# Patient Record
Sex: Male | Born: 1980
Health system: Southern US, Community
[De-identification: ages and names within clinical notes are randomized; demographics above are authoritative.]

---
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
## 2002-05-30 ENCOUNTER — Encounter: Payer: Self-pay | Admitting: Emergency Medicine

## 2002-05-30 ENCOUNTER — Emergency Department (HOSPITAL_COMMUNITY): Admission: EM | Admit: 2002-05-30 | Discharge: 2002-05-30 | Payer: Self-pay | Admitting: Emergency Medicine

## 2003-01-26 ENCOUNTER — Emergency Department (HOSPITAL_COMMUNITY): Admission: EM | Admit: 2003-01-26 | Discharge: 2003-01-26 | Payer: Self-pay

## 2003-09-22 ENCOUNTER — Emergency Department (HOSPITAL_COMMUNITY): Admission: EM | Admit: 2003-09-22 | Discharge: 2003-09-23 | Payer: Self-pay | Admitting: Emergency Medicine

## 2003-09-24 ENCOUNTER — Emergency Department (HOSPITAL_COMMUNITY): Admission: EM | Admit: 2003-09-24 | Discharge: 2003-09-24 | Payer: Self-pay | Admitting: *Deleted

## 2004-12-03 ENCOUNTER — Ambulatory Visit: Payer: Self-pay | Admitting: Family Medicine

## 2004-12-11 ENCOUNTER — Ambulatory Visit: Payer: Self-pay | Admitting: Sports Medicine

## 2005-01-31 ENCOUNTER — Emergency Department (HOSPITAL_COMMUNITY): Admission: EM | Admit: 2005-01-31 | Discharge: 2005-01-31 | Payer: Self-pay | Admitting: Emergency Medicine

## 2005-02-06 ENCOUNTER — Emergency Department (HOSPITAL_COMMUNITY): Admission: EM | Admit: 2005-02-06 | Discharge: 2005-02-06 | Payer: Self-pay | Admitting: Emergency Medicine

## 2006-07-19 ENCOUNTER — Ambulatory Visit: Payer: Self-pay | Admitting: Family Medicine

## 2006-07-21 ENCOUNTER — Ambulatory Visit: Payer: Self-pay | Admitting: Sports Medicine

## 2007-01-06 ENCOUNTER — Emergency Department (HOSPITAL_COMMUNITY): Admission: EM | Admit: 2007-01-06 | Discharge: 2007-01-07 | Payer: Self-pay | Admitting: Emergency Medicine

## 2008-09-02 ENCOUNTER — Telehealth: Payer: Self-pay | Admitting: *Deleted

## 2008-09-03 ENCOUNTER — Ambulatory Visit: Payer: Self-pay | Admitting: Family Medicine

## 2008-09-03 ENCOUNTER — Encounter (INDEPENDENT_AMBULATORY_CARE_PROVIDER_SITE_OTHER): Payer: Self-pay | Admitting: Family Medicine

## 2008-09-04 LAB — CONVERTED CEMR LAB
Chlamydia, Swab/Urine, PCR: NEGATIVE
GC Probe Amp, Urine: NEGATIVE

## 2009-02-13 ENCOUNTER — Emergency Department (HOSPITAL_COMMUNITY): Admission: EM | Admit: 2009-02-13 | Discharge: 2009-02-13 | Payer: Self-pay | Admitting: Emergency Medicine

## 2009-02-25 ENCOUNTER — Ambulatory Visit: Payer: Self-pay | Admitting: Family Medicine

## 2009-08-04 ENCOUNTER — Encounter: Payer: Self-pay | Admitting: Family Medicine

## 2009-08-04 ENCOUNTER — Ambulatory Visit: Payer: Self-pay | Admitting: Family Medicine

## 2009-08-04 DIAGNOSIS — F1010 Alcohol abuse, uncomplicated: Secondary | ICD-10-CM | POA: Insufficient documentation

## 2009-08-04 DIAGNOSIS — R599 Enlarged lymph nodes, unspecified: Secondary | ICD-10-CM | POA: Insufficient documentation

## 2009-08-04 DIAGNOSIS — F17200 Nicotine dependence, unspecified, uncomplicated: Secondary | ICD-10-CM

## 2009-08-04 LAB — CONVERTED CEMR LAB
Chlamydia, Swab/Urine, PCR: NEGATIVE
GC Probe Amp, Urine: NEGATIVE

## 2009-08-06 ENCOUNTER — Encounter: Payer: Self-pay | Admitting: Family Medicine

## 2009-11-03 ENCOUNTER — Encounter: Payer: Self-pay | Admitting: Family Medicine

## 2009-11-03 ENCOUNTER — Ambulatory Visit: Payer: Self-pay | Admitting: Family Medicine

## 2009-11-03 LAB — CONVERTED CEMR LAB
Basophils Relative: 0 % (ref 0–1)
CO2: 27 meq/L (ref 19–32)
Calcium: 9.4 mg/dL (ref 8.4–10.5)
HDL: 70 mg/dL (ref >39)
Lymphs Abs: 3.2 10*3/uL (ref 0.7–4.0)
Monocytes Relative: 10 % (ref 3–12)
Neutro Abs: 0.7 10*3/uL — ABNORMAL LOW (ref 1.7–7.7)
Neutrophils Relative %: 16 % — ABNORMAL LOW (ref 43–77)
RBC: 4.68 M/uL (ref 4.22–5.81)
Sodium: 140 meq/L (ref 135–145)
Total CHOL/HDL Ratio: 2
WBC: 4.3 10*3/uL (ref 4.0–10.5)

## 2009-11-07 ENCOUNTER — Telehealth: Payer: Self-pay | Admitting: Family Medicine

## 2009-11-07 DIAGNOSIS — D708 Other neutropenia: Secondary | ICD-10-CM

## 2009-11-11 ENCOUNTER — Encounter: Payer: Self-pay | Admitting: Family Medicine

## 2009-11-13 ENCOUNTER — Encounter: Payer: Self-pay | Admitting: Family Medicine

## 2009-11-13 ENCOUNTER — Ambulatory Visit: Payer: Self-pay | Admitting: Family Medicine

## 2009-11-13 LAB — CONVERTED CEMR LAB
Basophils Absolute: 0 10*3/uL (ref 0.0–0.1)
Basophils Relative: 0 % (ref 0–1)
Eosinophils Absolute: 0.2 10*3/uL (ref 0.0–0.7)
Lymphs Abs: 2.1 10*3/uL (ref 0.7–4.0)
MCHC: 34.1 g/dL (ref 30.0–36.0)
MCV: 96.6 fL (ref 78.0–100.0)
Neutrophils Relative %: 22 % — ABNORMAL LOW (ref 43–77)
Platelets: 260 10*3/uL (ref 150–400)
RDW: 11.8 % (ref 11.5–15.5)
WBC: 3.3 10*3/uL — ABNORMAL LOW (ref 4.0–10.5)

## 2009-11-20 ENCOUNTER — Telehealth: Payer: Self-pay | Admitting: Family Medicine

## 2009-11-27 ENCOUNTER — Ambulatory Visit: Payer: Self-pay | Admitting: Family Medicine

## 2009-11-28 ENCOUNTER — Ambulatory Visit: Payer: Self-pay | Admitting: Oncology

## 2009-12-18 ENCOUNTER — Telehealth: Payer: Self-pay | Admitting: *Deleted

## 2010-01-08 ENCOUNTER — Telehealth (INDEPENDENT_AMBULATORY_CARE_PROVIDER_SITE_OTHER): Payer: Self-pay | Admitting: Family Medicine

## 2010-01-08 ENCOUNTER — Telehealth: Payer: Self-pay | Admitting: Family Medicine

## 2010-01-21 ENCOUNTER — Ambulatory Visit: Payer: Self-pay | Admitting: Oncology

## 2010-01-22 ENCOUNTER — Encounter: Payer: Self-pay | Admitting: Family Medicine

## 2010-01-22 LAB — MORPHOLOGY

## 2010-01-22 LAB — CHCC SMEAR

## 2010-01-22 LAB — CBC WITH DIFFERENTIAL/PLATELET
EOS%: 5.8 % (ref 0.0–7.0)
LYMPH%: 55.2 % — ABNORMAL HIGH (ref 14.0–49.0)
MCH: 33.7 pg — ABNORMAL HIGH (ref 27.2–33.4)
MCHC: 34.5 g/dL (ref 32.0–36.0)
MCV: 97.5 fL (ref 79.3–98.0)
MONO%: 12.3 % (ref 0.0–14.0)
RBC: 4.29 10*6/uL (ref 4.20–5.82)
RDW: 11.9 % (ref 11.0–14.6)

## 2010-01-23 LAB — COMPREHENSIVE METABOLIC PANEL
ALT: 14 U/L (ref 0–53)
AST: 23 U/L (ref 0–37)
CO2: 24 mEq/L (ref 19–32)
Calcium: 9.2 mg/dL (ref 8.4–10.5)
Chloride: 103 mEq/L (ref 96–112)
Sodium: 138 mEq/L (ref 135–145)
Total Bilirubin: 0.5 mg/dL (ref 0.3–1.2)
Total Protein: 7.1 g/dL (ref 6.0–8.3)

## 2010-01-23 LAB — ANA: Anti Nuclear Antibody(ANA): NEGATIVE

## 2010-01-23 LAB — HEPATITIS C ANTIBODY: HCV Ab: NEGATIVE

## 2010-01-23 LAB — FOLATE: Folate: 11 ng/mL

## 2010-04-28 ENCOUNTER — Ambulatory Visit: Payer: Self-pay | Admitting: Oncology

## 2010-10-13 NOTE — Assessment & Plan Note (Signed)
Summary: knot on neck,df   Vital Signs:  Patient profile:   30 year old male Weight:      137.1 pounds Temp:     97.9 degrees F oral Pulse rate:   84 / minute Pulse rhythm:   regular BP sitting:   128 / 84  (right arm) Cuff size:   regular  Vitals Entered By: Loralee Pacas CMA (November 03, 2009 8:57 AM)  CC:  lymphadenopathy, damily hx dm, and tobacco.  History of Present Illness: 1.  cervical lymphadenopathy--seen in noveberm 2011 for cervical LAD.  all LNs <1 cm.  plan was to check CBC and watchful waiting.  Pt did not come back for cbc.  Today he comes in for follow up.  he states they go up and down.  not any bigger.  not tender.  no axillary or groin nodes.    2.  family hx of diabetes--needs screening.  Father and uncle have diabetes and a few other family members  3.  tobacco--still smoking.  has smoking cessation brochure at home.  has not really thought about specific strategies for quitting.    Allergies: No Known Drug Allergies  Review of Systems General:  Denies chills, fatigue, fever, malaise, sweats, and weight loss. ENT:  Denies difficulty swallowing, ear discharge, earache, hoarseness, nasal congestion, and nosebleeds; ocassional sore throat. Resp:  Denies cough, shortness of breath, and wheezing.  Physical Exam  General:  Well-developed,well-nourished,in no acute distress; alert,appropriate and cooperative throughout examination Neck:  anterior and posterior shotty cervical lymphadenopathy all <1 cm Cervical Nodes:  see neck exam Axillary Nodes:  No palpable lymphadenopathy Inguinal Nodes:  No significant adenopathy Additional Exam:  vital signs reviewed    Impression & Recommendations:  Problem # 1:  LYMPHADENOPATHY (ICD-785.6) Assessment Unchanged  unchanged from previous exam.  cannot appreciate axilla or groin adenopathy.  no red flag symptoms to indicate an ominous cause.  He does endorse some post nasal drip-->wonder if this is the cause.  check  CBC with diff.    Orders: [facility]- Est  Level 4 ([address])  Problem # 2:  Family Hx of DM (ICD-250.00) Assessment: Unchanged  check fasting glucose for screening.  also FLP  Orders: [facility]- Est  Level 4 ([address])  Problem # 3:  TOBACCO ABUSE (ICD-305.1) Assessment: Unchanged  already has smoking cessation brochure.  I think he is really more at the pre-contemplation stage  Orders: [facility]- Est  Level 4 ([address])  Patient Instructions: 1)  It was nice to see you today. 2)  I will call you if your labs are not normal.  Otherwise, I will send you a letter. 3)  Let us know if you need help with quitting smoking.  Try calling 1-800-QUITNOW.   4)  Please schedule a follow-up appointment in as needed.   Be sure to come in if the knots in your neck get bigger or if you start getting new ones in your neck, armpits, or groin.   Geriatric Assessment:  Activities of Daily Living:    Bathing-independent    Dressing-independent    Eating-independent    Toileting-independent    Transferring-independent    Continence-independent  Instrumental Activities of Daily Living:    Transportation-independent    Meal/Food Preparation-independent    Shopping Errands-independent    Housekeeping/Chores-independent    Money Management/Finances-independent    Medication Management-independent    Ability to Use Telephone-independent    Laundry-independent  Appended Document: knot on neck,df    Clinical Lists Changes  Problems:  Removed problem of Family history of  DM (ICD-250.00) Added new problem of FAMILY HISTORY OF DIABETES MELLITUS (ICD-V18.0)

## 2010-10-13 NOTE — Progress Notes (Signed)
Summary: phn msg  Phone Note Call from Patient Call back at Home Phone [phone]   Caller: Patient Summary of Call: cannot come today b/c of transporation, wants to know if doc can make appt for Hem instead of having to come into this clinic first.  pls advise Initial call taken by: De Nurse,  January 08, 2010 10:02 AM  Follow-up for Phone Call        to PCP Follow-up by: Gladstone Pih,  January 08, 2010 12:00 PM  Additional Follow-up for Phone Call Additional follow up Details #1::        yes.  I have called heme-onc and they are willing to see him.  please let him know that he MUST get to this next appointment or they will not reschedule.  they have not given [place] an appt time yet, but we will let him know as soon as they call us.  thanks Additional Follow-up by: Asher Muir MD,  January 08, 2010 12:40 PM    Additional Follow-up for Phone Call Additional follow up Details #2::    Pt notified, voiced understanding Follow-up by: Gladstone Pih,  January 08, 2010 1:46 PM

## 2010-10-13 NOTE — Progress Notes (Signed)
Summary: appt  Phone Note Call from Patient Call back at [phone]   Reason for Call: Talk to Nurse Summary of Call: mom is calling re: pts appt [facility] the [facility], he missed the appt and was told he needs to contact his pcp Initial call taken by: Knox Royalty,  December 18, 2009 11:27 AM  Follow-up for Phone Call        spoke with pts mother and asked why did he miss his appt and she stated that it was bc of transportation issues. I called Renee @ [facility] and left vm Follow-up by: Loralee Pacas CMA,  December 19, 2009 9:42 AM

## 2010-10-13 NOTE — Miscellaneous (Signed)
Summary: Orders Update  Clinical Lists Changes  Orders: Added new Test order of [facility] ([id number]) - Signed

## 2010-10-13 NOTE — Progress Notes (Signed)
  Phone Note Outgoing Call   Call placed by: Asher Muir MD,  November 07, 2009 1:28 PM Summary of Call: called pt.  neutrophil count low.  need to recheck next week.  could be from viral suppression.  asked him to make a lab appt for next thursday.  i will put in order.  pt expressed understanding Initial call taken by: Asher Muir MD,  November 07, 2009 1:29 PM  New Problems: OTHER NEUTROPENIA (ICD-288.09)   New Problems: OTHER NEUTROPENIA (ICD-288.09)

## 2010-10-13 NOTE — Consult Note (Signed)
Summary: [facility]  [facility]   Imported By: Clydell Hakim 02/09/2010 08:44:23  _____________________________________________________________________  External Attachment:    Type:   Image     Comment:   External Document  Appended Document: [facility]    Clinical Lists Changes  Problems: Assessed OTHER NEUTROPENIA as unchanged - UPDATE FROM HEME/ONC CONSULTATION:   Seen by Dr. Gaylyn Rong, who believes that alcohol abuse is most likely cause of his neutropenia.  Pt declined bone marrow biopsy at this time.  Plan is for Jonathin to stop drinking for the next 3-4 months.  He is to see Dr. Gaylyn Rong in August 2011 and have repeat CBC.  If still neutropenic while not drinking, then bone marrow biopsy will be performed at that time.       Impression & Recommendations:  Problem # 1:  OTHER NEUTROPENIA (ICD-288.09) Assessment Unchanged UPDATE FROM HEME/ONC CONSULTATION:   Seen by Dr. Gaylyn Rong, who believes that alcohol abuse is most likely cause of his neutropenia.  Pt declined bone marrow biopsy at this time.  Plan is for Carless to stop drinking for the next 3-4 months.  He is to see Dr. Gaylyn Rong in August 2011 and have repeat CBC.  If still neutropenic while not drinking, then bone marrow biopsy will be performed at that time.

## 2010-10-13 NOTE — Progress Notes (Signed)
  Phone Note Call from Patient   Summary of Call: pt rescheduled appt to next thursday.  called him and told him hiv negative, but one of his cell lines low.  emphasized need to come to appt.  he expressed understanding  Initial call taken by: Asher Muir MD,  November 20, 2009 11:45 AM

## 2010-10-13 NOTE — Progress Notes (Signed)
  Phone Note Outgoing Call   Call placed by: Asher Muir MD,  January 08, 2010 9:25 AM Summary of Call: Pt has rescheduled/no-showed for 3 appointments.  Drucie Ip (new pt scheduler) stated that I needed to speak with Dr Gaylyn Rong (the hematologist with whom Jaque was scheduled).  I spoke with Dr Gaylyn Rong who stated it was OK to reschedule appt.  I called and left a mssg for Rena asking her to call me back. Initial call taken by: Asher Muir MD,  January 08, 2010 9:28 AM  Follow-up for Phone Call        would you mind calling the patient or heme to see if he got an appointment?  thanks! Follow-up by: Asher Muir MD,  Jan 12, 2010 11:35 AM  Additional Follow-up for Phone Call Additional follow up Details #1::        Apt on May 12th Additional Follow-up by: Gladstone Pih,  Jan 12, 2010 11:57 AM

## 2010-10-13 NOTE — Letter (Signed)
Summary: Generic Letter  [facility]  [address]   [place], [place] [address]   Phone: [phone]  Fax: [phone]    11/03/2009  Taniela Locken [address] [place], [place]  [address]  To whom it may concern:  Mr Pellow is able to function independently as an adult.  He manages his own finances, lives alone.  He completes all of his ADLs and IADLs independently.  Please call me if you have any questions or concerns.              Sincerely,   Asher Muir MD

## 2010-10-13 NOTE — Assessment & Plan Note (Signed)
Summary: f/u eo   Vital Signs:  Patient profile:   30 year old male Weight:      136.2 pounds Temp:     97.9 degrees F oral Pulse rate:   75 / minute Pulse rhythm:   regular BP sitting:   129 / 86  (left arm) Cuff size:   regular  Vitals Entered By: Loralee Pacas CMA (November 27, 2009 1:53 PM)  CC:  lymphadenopathy, neutropenia, and alcohol use.  History of Present Illness: 1.  cervical lymphadenopathy--seen in noveber 2011 for cervical LAD.  all LNs <1 cm.  CBC showed neutropenia (ANC 700).  Pt notices that LNs get bigger and smaller.  never felt any in his axilla, but has felt some in his groin before.    2.  neutropenia--anc 700.  repeat with smear was again 700.  WBC initiall normal.  then on repeat only slightly low at 3.3.  hiv negative.  no known family hx of neutropenia or other white cell disorders.  see extensive ROS, which is essentially negative.  he wonders if his alcohol use could contribute.  3.  alcohol use--  drinks 5-6 40 oz beers most days.  some days does not drink at all.  will drink beers in the am if he has them, but does not need an eye opener.  thinks he could quit on his own; does not think he needs AA  Allergies: No Known Drug Allergies  Past History:  Past Medical History: Treated for chlamydia learning disabilities in school.  can read and write.    Past Surgical History: Reviewed history from 08/04/2009 and no changes required. age 17YO had "fluid on the brain"  had surgery for this.  no shunt.  no problems since.  cared for at [facility]  Social History: Reviewed history from 08/04/2009 and no changes required. lives alone.  on disability from the brain surgery.  smokes 1/2 ppd.  drinks about 5-6 40oz beers per day.     1 sexual partner.  straight  Review of Systems General:  Denies fatigue, fever, malaise, and weight loss; appetitie not very good, but thinks that is because he drinks lots of beer; sleep is good. ENT:  Denies ear discharge,  earache, nasal congestion, nosebleeds, postnasal drainage, and sore throat. CV:  Denies chest pain or discomfort and difficulty breathing while lying down. Resp:  Denies cough, coughing up blood, hypersomnolence, and morning headaches. GI:  Denies abdominal pain, bloody stools, constipation, and nausea. MS:  Denies joint pain and muscle weakness. Derm:  Denies rash. Neuro:  Denies difficulty with concentration, falling down, headaches, memory loss, and poor balance. Heme:  Complains of enlarge lymph nodes; denies bleeding and fevers.  Physical Exam  General:  Well-developed,well-nourished,in no acute distress; alert,appropriate and cooperative throughout examination Eyes:  normal appearance Ears:  tms normal Nose:  External nasal examination shows no deformity or inflammation. Nasal mucosa are pink and moist without lesions or exudates. Mouth:  mild pharyngeal erythema and 2+ tonsils, but no exudate.  no oral lesions or obvious gingival abnormalities Neck:  anterior and posterior shotty cervical lymphadenopathy all <1 cm Lungs:  Normal respiratory effort, chest expands symmetrically. Lungs are clear to auscultation, no crackles or wheezes. Heart:  Normal rate and regular rhythm. S1 and S2 normal without gallop, murmur, click, rub or other extra sounds. Abdomen:  soft, non-tender, and normal bowel sounds.  no splenomegaly Extremities:  no edema Skin:  Intact without suspicious lesions or rashes Cervical Nodes:  see  neck exam Axillary Nodes:  No palpable lymphadenopathy Inguinal Nodes:  No palpable adenopathy Additional Exam:  vital signs reviewed    Impression & Recommendations:  Problem # 1:  OTHER NEUTROPENIA (ICD-288.09) Assessment Unchanged moderate neutropenia with an ANC of 700 that was verified on repeat CBC.  unfortunately, peripheral smear did not meet criteria to get good morphology.  Possible causes of this are numerous and include:  a benign process associated with  ethnicity, cyclic neutropenia, alcohol use, autoimmune neutropenia, and many others.  He does not really have any symptoms.  An extensive ROS was negative, except for heavy alcohol use.  He does not use cocaine (which apparently can cause isolated neutropenia in rare instances).  He is not on any medications that would cause this.  I think the most prudent move is to refer to heme-onc for further evaluation.    Orders: Hematology Referral (Hematology) [facility]- Est  Level 4 ([address])  Problem # 2:  LYMPHADENOPATHY (ICD-785.6) Assessment: Unchanged  still all<1 cm.  none of the nodes are getting any bigger.  cannot appreciate any groin or axilla adenopathy.  continue to monitor  Orders: [facility]- Est  Level 4 ([address])  Problem # 3:  ALCOHOL ABUSE (ICD-305.00) Assessment: Unchanged  discussed that the amount he drinks is far above the healthy limit (which is 2 twelve ounce beers per day or less).  gave info on AA.  he agrees he needs to cut back.  unfortunately, at this time, he is not really interested in AA or other support groups  Orders: [facility]- Est  Level 4 ([address])  Patient Instructions: 1)  It was nice to see you today. 2)  We will refer you to the hematologist (blood doctor).  Be sure you go to that appointment. 3)  I think you have a drinking problem.  If you have a hard time cutting back on your own, think about going to alcohol[facility] (AA).  Let us know if we can help you.

## 2011-04-05 ENCOUNTER — Emergency Department (HOSPITAL_COMMUNITY): Payer: Self-pay

## 2011-04-05 ENCOUNTER — Emergency Department (HOSPITAL_COMMUNITY)
Admission: EM | Admit: 2011-04-05 | Discharge: 2011-04-05 | Discharge status: Against Medical Advice | Payer: Self-pay | Attending: Emergency Medicine | Admitting: Emergency Medicine

## 2011-04-05 DIAGNOSIS — Y9367 Activity, basketball: Secondary | ICD-10-CM | POA: Insufficient documentation

## 2011-04-05 DIAGNOSIS — T1490XA Injury, unspecified, initial encounter: Secondary | ICD-10-CM | POA: Insufficient documentation

## 2011-04-05 DIAGNOSIS — X58XXXA Exposure to other specified factors, initial encounter: Secondary | ICD-10-CM | POA: Insufficient documentation

## 2014-09-02 ENCOUNTER — Ambulatory Visit: Payer: Self-pay | Admitting: Family Medicine

## 2016-10-07 ENCOUNTER — Ambulatory Visit (HOSPITAL_COMMUNITY)
Admission: EM | Admit: 2016-10-07 | Discharge: 2016-10-07 | Disposition: A | Discharge status: Home / Self Care | Payer: Medicare Other | Attending: Family Medicine | Admitting: Family Medicine

## 2016-10-07 ENCOUNTER — Encounter (HOSPITAL_COMMUNITY): Payer: Self-pay | Admitting: Emergency Medicine

## 2016-10-07 DIAGNOSIS — Z202 Contact with and (suspected) exposure to infections with a predominantly sexual mode of transmission: Secondary | ICD-10-CM

## 2016-10-07 DIAGNOSIS — F17210 Nicotine dependence, cigarettes, uncomplicated: Secondary | ICD-10-CM | POA: Insufficient documentation

## 2016-10-07 DIAGNOSIS — R369 Urethral discharge, unspecified: Secondary | ICD-10-CM | POA: Diagnosis not present

## 2016-10-07 DIAGNOSIS — R300 Dysuria: Secondary | ICD-10-CM | POA: Diagnosis not present

## 2016-10-07 MED ORDER — STERILE WATER FOR INJECTION IJ SOLN
INTRAMUSCULAR | Status: AC
  Filled 2016-10-07: qty 10

## 2016-10-07 MED ORDER — AZITHROMYCIN 250 MG PO TABS
1000.0000 mg | ORAL_TABLET | Freq: Once | ORAL | Status: AC
  Administered 2016-10-07: 1000 mg via ORAL

## 2016-10-07 MED ORDER — CEFTRIAXONE SODIUM 250 MG IJ SOLR
INTRAMUSCULAR | Status: AC
  Filled 2016-10-07: qty 250

## 2016-10-07 MED ORDER — AZITHROMYCIN 250 MG PO TABS
ORAL_TABLET | ORAL | Status: AC
  Filled 2016-10-07: qty 4

## 2016-10-07 MED ORDER — CEFTRIAXONE SODIUM 250 MG IJ SOLR
250.0000 mg | Freq: Once | INTRAMUSCULAR | Status: AC
  Administered 2016-10-07: 250 mg via INTRAMUSCULAR

## 2016-10-07 NOTE — Discharge Instructions (Signed)
You are being treated today for STD exposure. You have been given an injection of rocephin and a tablet of Azithromycin. You are being tested for Gonorrhea, chlamydia. Syphilis, and HIV.  You will be notified of your results. Should your symptoms fail to resolve or worsen follow up with your primary care provider or return to clinic.

## 2016-10-07 NOTE — ED Provider Notes (Signed)
CSN: [id number]     Arrival date & time 10/07/16  1252 History   First MD Initiated Contact with Patient 10/07/16 1348     Chief Complaint  Patient presents with  . Exposure to STD   (Consider location/radiation/quality/duration/timing/severity/associated sxs/prior Treatment) 36 year old male presents with 3 day history of penile discharge and painful urination. He denies fever, nausea, abdominal pain, or flank pain. He is sexually active with multiple partners, intermittent condom usage.   The history is provided by the patient.    History reviewed. No pertinent past medical history. History reviewed. No pertinent surgical history. History reviewed. No pertinent family history. Social History  Substance Use Topics  . Smoking status: Current Every Day Smoker    Packs/day: 1.00    Types: Cigars  . Smokeless tobacco: Never Used  . Alcohol use Yes    Review of Systems  Reason unable to perform ROS: as covered in HPI.  All other systems reviewed and are negative.   Allergies  Patient has no known allergies.  Home Medications   Prior to Admission medications   Not on File   Meds Ordered and Administered this Visit   Medications  azithromycin (ZITHROMAX) tablet 1,000 mg (not administered)  cefTRIAXone (ROCEPHIN) injection 250 mg (not administered)    BP 147/78 (BP Location: Left Arm)   Pulse 89   Temp 98.1 F (36.7 C) (Oral)   Resp 16   SpO2 98%  No data found.   Physical Exam  Constitutional: He appears well-developed and well-nourished. No distress.  Cardiovascular: Normal rate and regular rhythm.   Pulmonary/Chest: Effort normal and breath sounds normal.  Abdominal: Soft. Bowel sounds are normal.  Genitourinary:  Genitourinary Comments: Deferred at pt request.  Skin: Skin is warm and dry. Capillary refill takes less than 2 seconds. He is not diaphoretic.  Psychiatric: He has a normal mood and affect.  Nursing note and vitals reviewed.   Urgent Care  Course     Procedures (including critical care time)  Labs Review Labs Reviewed  HIV ANTIBODY (ROUTINE TESTING)  RPR  URINE CYTOLOGY ANCILLARY ONLY    Imaging Review No results found.   Visual Acuity Review  Right Eye Distance:   Left Eye Distance:   Bilateral Distance:    Right Eye Near:   Left Eye Near:    Bilateral Near:         MDM   1. Penile discharge   2. STD exposure   You are being treated today for STD exposure. You have been given an injection of rocephin and a tablet of Azithromycin. You are being tested for Gonorrhea, chlamydia. Syphilis, and HIV.  You will be notified of your results. Should your symptoms fail to resolve or worsen follow up with your primary care provider or return to clinic.     Dorena BodoLawrence Asheton Viramontes, NP 10/07/16 1428

## 2016-10-07 NOTE — ED Triage Notes (Signed)
Here for yellow penile d/c   Sexual active w/mult partners w/occasional condom use  Here to be checked for STDs  Denies fevers  A&O x4... NAD

## 2016-10-08 LAB — URINE CYTOLOGY ANCILLARY ONLY
Chlamydia: NEGATIVE
Neisseria Gonorrhea: POSITIVE — AB

## 2016-10-08 LAB — HIV ANTIBODY (ROUTINE TESTING W REFLEX): HIV SCREEN 4TH GENERATION: NONREACTIVE

## 2016-10-08 LAB — RPR: RPR Ser Ql: NONREACTIVE

## 2017-04-04 ENCOUNTER — Emergency Department (HOSPITAL_COMMUNITY)
Admission: EM | Admit: 2017-04-04 | Discharge: 2017-04-05 | Disposition: A | Discharge status: Home / Self Care | Payer: Medicare Other | Attending: Emergency Medicine | Admitting: Emergency Medicine

## 2017-04-04 ENCOUNTER — Encounter (HOSPITAL_COMMUNITY): Payer: Self-pay | Admitting: Emergency Medicine

## 2017-04-04 ENCOUNTER — Emergency Department (HOSPITAL_COMMUNITY)
Admission: EM | Admit: 2017-04-04 | Discharge: 2017-04-04 | Discharge status: Against Medical Advice | Payer: Medicare Other | Source: Home / Self Care

## 2017-04-04 ENCOUNTER — Encounter (HOSPITAL_COMMUNITY): Payer: Self-pay

## 2017-04-04 DIAGNOSIS — Z5321 Procedure and treatment not carried out due to patient leaving prior to being seen by health care provider: Secondary | ICD-10-CM

## 2017-04-04 DIAGNOSIS — Y939 Activity, unspecified: Secondary | ICD-10-CM | POA: Insufficient documentation

## 2017-04-04 DIAGNOSIS — Y999 Unspecified external cause status: Secondary | ICD-10-CM | POA: Insufficient documentation

## 2017-04-04 DIAGNOSIS — T7840XA Allergy, unspecified, initial encounter: Secondary | ICD-10-CM | POA: Insufficient documentation

## 2017-04-04 DIAGNOSIS — W57XXXA Bitten or stung by nonvenomous insect and other nonvenomous arthropods, initial encounter: Secondary | ICD-10-CM | POA: Diagnosis not present

## 2017-04-04 DIAGNOSIS — R2232 Localized swelling, mass and lump, left upper limb: Secondary | ICD-10-CM | POA: Insufficient documentation

## 2017-04-04 DIAGNOSIS — S60461A Insect bite (nonvenomous) of left index finger, initial encounter: Secondary | ICD-10-CM | POA: Diagnosis present

## 2017-04-04 DIAGNOSIS — Y929 Unspecified place or not applicable: Secondary | ICD-10-CM | POA: Insufficient documentation

## 2017-04-04 DIAGNOSIS — F17290 Nicotine dependence, other tobacco product, uncomplicated: Secondary | ICD-10-CM | POA: Diagnosis not present

## 2017-04-04 DIAGNOSIS — S60562A Insect bite (nonvenomous) of left hand, initial encounter: Secondary | ICD-10-CM | POA: Diagnosis not present

## 2017-04-04 MED ORDER — DIPHENHYDRAMINE HCL 25 MG PO CAPS: 25.0000 mg | ORAL_CAPSULE | Freq: Four times a day (QID) | ORAL | 0 refills | Status: DC | PRN

## 2017-04-04 MED ORDER — IBUPROFEN 400 MG PO TABS
ORAL_TABLET | ORAL | Status: AC
  Filled 2017-04-04: qty 1

## 2017-04-04 NOTE — ED Provider Notes (Signed)
[facility]-EMERGENCY DEPT Provider Note   CSN: [id number] Arrival date & time: 04/04/17  2337     History   Chief Complaint Chief Complaint  Patient presents with  . Insect Bite    HPI Sean Gutierrez is a 36 y.o. male.  HPI  Patient presents to ED for evaluation of spider bite on left second digit. He states that the bite occurred prior to arrival. He is concerned that is a poisonous spider. He denies any previous reaction to spiders in the past. He reports gradual worsening of the swelling since the bite occurred. He has not tried taking any medications prior to arrival. He denies any fever, chills, injury, numbness, weakness, drainage or bleeding from site.  History reviewed. No pertinent past medical history.  Patient Active Problem List   Diagnosis Date Noted  . OTHER NEUTROPENIA 11/07/2009  . ALCOHOL ABUSE 08/04/2009  . TOBACCO ABUSE 08/04/2009  . LYMPHADENOPATHY 08/04/2009    History reviewed. No pertinent surgical history.     Home Medications    Prior to Admission medications   Medication Sig Start Date End Date Taking? Authorizing Provider  diphenhydrAMINE (BENADRYL) 25 mg capsule Take 1 capsule (25 mg total) by mouth every 6 (six) hours as needed. 04/04/17   Dietrich Pates, PA-C    Family History History reviewed. No pertinent family history.  Social History Social History  Substance Use Topics  . Smoking status: Current Every Day Smoker    Packs/day: 1.00    Types: Cigars  . Smokeless tobacco: Never Used  . Alcohol use Yes     Allergies   Patient has no known allergies.   Review of Systems Review of Systems  Constitutional: Negative for chills and fever.  HENT: Negative for drooling, sore throat, trouble swallowing and voice change.   Respiratory: Negative for shortness of breath, wheezing and stridor.   Gastrointestinal: Negative for nausea and vomiting.  Musculoskeletal: Positive for arthralgias.  Skin: Positive for color change and rash.      Physical Exam Updated Vital Signs There were no vitals taken for this visit.  Physical Exam  Constitutional: He appears well-developed and well-nourished. No distress.  HENT:  Head: Normocephalic and atraumatic.  Eyes: Conjunctivae and EOM are normal. No scleral icterus.  Neck: Normal range of motion.  Cardiovascular: Normal rate, regular rhythm and normal heart sounds.   Pulmonary/Chest: Effort normal and breath sounds normal. No respiratory distress.  Neurological: He is alert.  Skin: Rash noted. He is not diaphoretic.  Erythema and swelling noted around the left second digit knuckle. Pain with movement of the digit. Full active and passive range of motion of the wrist and other digits. There is no abscess or blistering noted.  Psychiatric: He has a normal mood and affect.  Nursing note and vitals reviewed.    ED Treatments / Results  Labs (all labs ordered are listed, but only abnormal results are displayed) Labs Reviewed - No data to display  EKG  EKG Interpretation None       Radiology No results found.  Procedures Procedures (including critical care time)  Medications Ordered in ED Medications - No data to display   Initial Impression / Assessment and Plan / ED Course  I have reviewed the triage vital signs and the nursing notes.  Pertinent labs & imaging results that were available during my care of the patient were reviewed by me and considered in my medical decision making (see chart for details).     Patient presents to  ED for evaluation of insect bite that he states occurred prior to arrival. He states that he is concerned that it is a poisonous spider bite. He reports gradual swelling and pain at the site. He denies any previous history of reaction to spider pain in the past. He denies fever, chills, nausea, vomiting, joint pains, vision changes, numbness, weakness, trouble walking. On physical exam there is localized erythematous reaction around  the left second digit with mild swelling present. Pt has a patent airway without stridor and is handling secretions without difficulty; no angioedema. No blisters, no pustules, no draining sinus tracts, no superficial abscesses, no bullous impetigo, no vesicles, no desquamation, no target lesions with dusky purpura or a central bulla. Low concern concern for superimposed infection. No concern for SJS, TEN, TSS, tick borne illness, syphilis or other life-threatening condition.   He has pain with movement of the digit but otherwise full active and passive range of motion of the digits and wrist. Patient has not tried any medications prior to arrival. He denies any history of fever or systemic symptoms. I suspect that this is a localized reaction to the insect bite that occurred. Since incident occurred today there is low likelihood that an infection has resulted this quickly. We'll advise patient to take Benadryl to prevent any further reaction. Will give patient a dose of Benadryl here in the ED but he is driving home. I encouraged him to pick it up on his way home to prevent any worsening of symptoms. Patient appears stable for discharge at this time. Strict return precautions given for severe or worsening symptoms.  Final Clinical Impressions(s) / ED Diagnoses   Final diagnoses:  Insect bite, initial encounter  Allergic reaction, initial encounter    New Prescriptions New Prescriptions   DIPHENHYDRAMINE (BENADRYL) 25 MG CAPSULE    Take 1 capsule (25 mg total) by mouth every 6 (six) hours as needed.     Dietrich PatesKhatri, Silvino Selman, PA-C 04/05/17 0001    Loren RacerYelverton, David, MD 04/07/17 2116

## 2017-04-04 NOTE — ED Notes (Signed)
Unable to locate pt x1; text page sent to patient via iConnect

## 2017-04-04 NOTE — ED Triage Notes (Signed)
Pt presents to ED for assessment of left first digit knuckle insect bite.  Pt states he walked through a spider web and is assuming it was aspider.  Some swelling and redness noted, marked.  Patient very anxious, concerned it was a poisonous spider and feels he needs to be seen by a provider immediately.  This RN attempted to reassure patient, patient asked to be alloowed back to lobby.

## 2017-04-04 NOTE — ED Triage Notes (Signed)
Patient here for spider bite to left hand.  Does not know what kind of spider but states it happed a hour ago.  States swelling began then. A&Ox4

## 2017-04-04 NOTE — Discharge Instructions (Signed)
Please read attached information regarding your condition. Take Benadryl every 6 hours as needed. Return to ED for worsening pain, increased swelling, lip swelling, trouble breathing, chest pain, signs of infection or fever.

## 2017-04-04 NOTE — ED Notes (Signed)
Unable to locate pt x2

## 2017-04-05 ENCOUNTER — Ambulatory Visit (HOSPITAL_COMMUNITY)
Admission: EM | Admit: 2017-04-05 | Discharge: 2017-04-05 | Disposition: A | Discharge status: Home / Self Care | Payer: Medicare Other | Attending: Family Medicine | Admitting: Family Medicine

## 2017-04-05 ENCOUNTER — Encounter (HOSPITAL_COMMUNITY): Payer: Self-pay | Admitting: Emergency Medicine

## 2017-04-05 DIAGNOSIS — W57XXXA Bitten or stung by nonvenomous insect and other nonvenomous arthropods, initial encounter: Secondary | ICD-10-CM

## 2017-04-05 DIAGNOSIS — S60461A Insect bite (nonvenomous) of left index finger, initial encounter: Secondary | ICD-10-CM

## 2017-04-05 DIAGNOSIS — M7989 Other specified soft tissue disorders: Secondary | ICD-10-CM

## 2017-04-05 DIAGNOSIS — R2232 Localized swelling, mass and lump, left upper limb: Secondary | ICD-10-CM

## 2017-04-05 MED ORDER — AMOXICILLIN-POT CLAVULANATE 875-125 MG PO TABS: 1.0000 | ORAL_TABLET | Freq: Two times a day (BID) | ORAL | 0 refills | Status: DC

## 2017-04-05 MED ORDER — METHYLPREDNISOLONE SODIUM SUCC 125 MG IJ SOLR
125.0000 mg | Freq: Once | INTRAMUSCULAR | Status: AC
  Administered 2017-04-05: 125 mg via INTRAMUSCULAR

## 2017-04-05 MED ORDER — METHYLPREDNISOLONE SODIUM SUCC 125 MG IJ SOLR
INTRAMUSCULAR | Status: AC
  Filled 2017-04-05: qty 2

## 2017-04-05 NOTE — ED Notes (Signed)
Patient Alert and oriented X4. Stable and ambulatory. Patient verbalized understanding of the discharge instructions.  Patient belongings were taken by the patient.  

## 2017-04-05 NOTE — ED Provider Notes (Signed)
  [facility]   [id number] 04/05/17 Arrival Time: 1001  ASSESSMENT & PLAN:  Today you were diagnosed with the following: 1. Insect bite, initial encounter   2. Swelling of left hand    You have been prescribed prescription medications this visit.   Meds ordered this encounter  Medications  . amoxicillin-clavulanate (AUGMENTIN) 875-125 MG tablet    Sig: Take 1 tablet by mouth every 12 (twelve) hours.    Dispense:  14 tablet    Refill:  0  . methylPREDNISolone sodium succinate (SOLU-MEDROL) 125 mg/2 mL injection 125 mg   Start taking the antibiotic this morning.  If you are not improving over the next 24 hours or feel you are worsening please follow up here or the Emergency Department if you are unable to see your regular doctor.  Question beginning of cellulitis vs inflammatory reaction to suspected insect bite. Reviewed expectations re: course of current medical issues. Questions answered. Outlined signs and symptoms indicating need for more acute intervention. Patient verbalized understanding. After Visit Summary given.   SUBJECTIVE:  Sean Gutierrez is a 36 y.o. male who presents with complaint of "insect bite" to L 2nd finger yesterday. Seen in the ED. Given Benadryl. No further self treatment by patient. This morning noticed L hand and wrist much more swollen. Painful with movement. Afebrile. No sensation changes.  ROS: As per HPI.   OBJECTIVE:  Vitals:   04/05/17 1011  BP: 133/86  Pulse: 97  Resp: 20  Temp: 98.4 F (36.9 C)  TempSrc: Oral  SpO2: 97%     General appearance: alert; no distress Lungs: clear to auscultation bilaterally Heart: regular rate and rhythm Extremities: L hand and wrist visibly swollen and tender to palpation; very slight erythema over L 2nd finger at PIP and MCP; hand slightly warm compared to opposite hand; sensation intact; normal capillary refill; decreased ROM secondary to discomfort   No Known Allergies  PMHx,  SurgHx, SocialHx, Medications, and Allergies were reviewed in the Visit Navigator and updated as appropriate.      Mardella LaymanHagler, Faysal Fenoglio, MD 04/05/17 1027

## 2017-04-05 NOTE — ED Triage Notes (Signed)
Pt here for possible spider bite to left hand onset last night.... Does not know what kind of spider but states his hand was caught in a web while lifting a window  Sts he woke up w/swelling of hand/forearm  [facility] sx  A&O x4... NAD... Ambulatory

## 2017-04-05 NOTE — Discharge Instructions (Signed)
Today you were diagnosed with the following: 1. Insect bite, initial encounter   2. Swelling of left hand    You have been prescribed prescription medications this visit.   Meds ordered this encounter  Medications   amoxicillin-clavulanate (AUGMENTIN) 875-125 MG tablet    Sig: Take 1 tablet by mouth every 12 (twelve) hours.    Dispense:  14 tablet    Refill:  0   methylPREDNISolone sodium succinate (SOLU-MEDROL) 125 mg/2 mL injection 125 mg   Start taking the antibiotic this morning.  If you are not improving over the next 24 hours or feel you are worsening please follow up here or the Emergency Department if you are unable to see your regular doctor.

## 2018-10-31 ENCOUNTER — Ambulatory Visit (HOSPITAL_COMMUNITY)
Admission: EM | Admit: 2018-10-31 | Discharge: 2018-10-31 | Disposition: A | Discharge status: Home / Self Care | Payer: Medicare Other | Attending: Family Medicine | Admitting: Family Medicine

## 2018-10-31 ENCOUNTER — Encounter (HOSPITAL_COMMUNITY): Payer: Self-pay

## 2018-10-31 ENCOUNTER — Ambulatory Visit (INDEPENDENT_AMBULATORY_CARE_PROVIDER_SITE_OTHER): Payer: Medicare Other

## 2018-10-31 DIAGNOSIS — R05 Cough: Secondary | ICD-10-CM

## 2018-10-31 DIAGNOSIS — R591 Generalized enlarged lymph nodes: Secondary | ICD-10-CM | POA: Diagnosis not present

## 2018-10-31 DIAGNOSIS — Z720 Tobacco use: Secondary | ICD-10-CM

## 2018-10-31 LAB — BASIC METABOLIC PANEL
ANION GAP: 9 (ref 5–15)
BUN: 9 mg/dL (ref 6–20)
CALCIUM: 10.1 mg/dL (ref 8.9–10.3)
CO2: 27 mmol/L (ref 22–32)
CREATININE: 0.96 mg/dL (ref 0.61–1.24)
Chloride: 104 mmol/L (ref 98–111)
GFR calc Af Amer: >60 mL/min (ref >60)
GLUCOSE: 98 mg/dL (ref 70–99)
Potassium: 4.3 mmol/L (ref 3.5–5.1)
Sodium: 140 mmol/L (ref 135–145)

## 2018-10-31 LAB — CBC WITH DIFFERENTIAL/PLATELET
Abs Immature Granulocytes: 0.02 10*3/uL (ref 0.00–0.07)
BASOS PCT: 0 %
Basophils Absolute: 0 10*3/uL (ref 0.0–0.1)
EOS ABS: 0.1 10*3/uL (ref 0.0–0.5)
Eosinophils Relative: 2 %
HCT: 44.9 % (ref 39.0–52.0)
Hemoglobin: 15.1 g/dL (ref 13.0–17.0)
IMMATURE GRANULOCYTES: 1 %
Lymphocytes Relative: 58 %
Lymphs Abs: 2.4 10*3/uL (ref 0.7–4.0)
MCH: 31.8 pg (ref 26.0–34.0)
MCHC: 33.6 g/dL (ref 30.0–36.0)
MCV: 94.5 fL (ref 80.0–100.0)
MONOS PCT: 8 %
Monocytes Absolute: 0.3 10*3/uL (ref 0.1–1.0)
NEUTROS PCT: 31 %
Neutro Abs: 1.3 10*3/uL — ABNORMAL LOW (ref 1.7–7.7)
PLATELETS: 256 10*3/uL (ref 150–400)
RBC: 4.75 MIL/uL (ref 4.22–5.81)
RDW: 11.8 % (ref 11.5–15.5)
WBC: 4.1 10*3/uL (ref 4.0–10.5)
nRBC: 0 % (ref 0.0–0.2)

## 2018-10-31 NOTE — ED Triage Notes (Signed)
Pt presents with swollen lymph nodes on both sides of neck and wanting to get an STD screening; pt has complaints of penile/groin pain.

## 2018-10-31 NOTE — Discharge Instructions (Signed)
Your x-ray was negative for any abnormalities We are sending some blood work in the lab results are still pending We will call you with any abnormal results We are also testing for STD and will call you with any positive results Follow up as needed for continued or worsening symptoms

## 2018-10-31 NOTE — ED Provider Notes (Signed)
[facility]    CSN: [id number] Arrival date & time: 10/31/18  1300     History   Chief Complaint Chief Complaint  Patient presents with  . Swollen lymph nodes  . STD Testing    HPI Sean Gutierrez is a 38 y.o. male.   Is a 38 year old male presents with lymphadenopathy.  This is been a waxing and waning problem for him for a few years.  He reports that the lymph nodes swell and then get better but never go away.  They do not cause him any pain.  He has no other symptoms related to this to include fatigue, fevers, cough, congestion, night sweats, body aches, sore throat.  He was seen a few years back for this and reports that he had some blood work done and it showed some abnormalities that need to be followed up on but he never did.  He would like to be checked for STDs today also.  He reports that he was checked a year ago everything was normal.  Patient with concerns of lung cancer based on heavy smoking history and lymphadenopathy.       History reviewed. No pertinent past medical history.  Patient Active Problem List   Diagnosis Date Noted  . OTHER NEUTROPENIA 11/07/2009  . ALCOHOL ABUSE 08/04/2009  . TOBACCO ABUSE 08/04/2009  . LYMPHADENOPATHY 08/04/2009    History reviewed. No pertinent surgical history.     Home Medications    Prior to Admission medications   Medication Sig Start Date End Date Taking? Authorizing Provider  diphenhydrAMINE (BENADRYL) 25 mg capsule Take 1 capsule (25 mg total) by mouth every 6 (six) hours as needed. 04/04/17   Dietrich PatesKhatri, Hina, PA-C    Family History Family History  Problem Relation Age of Onset  . Healthy Mother   . Healthy Father     Social History Social History   Tobacco Use  . Smoking status: Current Every Day Smoker    Packs/day: 1.00    Types: Cigars  . Smokeless tobacco: Never Used  Substance Use Topics  . Alcohol use: Yes  . Drug use: No     Allergies   Patient has no known  allergies.   Review of Systems Review of Systems  Constitutional: Negative for activity change, appetite change, chills, diaphoresis, fatigue, fever and unexpected weight change.  Eyes: Negative.   Respiratory: Negative.   Genitourinary: Negative.   Musculoskeletal: Negative for arthralgias and myalgias.  Hematological: Positive for adenopathy. Does not bruise/bleed easily.  All other systems reviewed and are negative.    Physical Exam Triage Vital Signs ED Triage Vitals  Enc Vitals Group     BP 10/31/18 1410 (!) 146/92     Pulse Rate 10/31/18 1410 76     Resp 10/31/18 1410 20     Temp 10/31/18 1410 98 F (36.7 C)     Temp Source 10/31/18 1410 Oral     SpO2 10/31/18 1410 99 %     Weight --      Height --      Head Circumference --      Peak Flow --      Pain Score 10/31/18 1412 6     Pain Loc --      Pain Edu? --      Excl. in GC? --    No data found.  Updated Vital Signs BP (!) 146/92 (BP Location: Right Arm)   Pulse 76   Temp 98 F (36.7 C) (  Oral)   Resp 20   SpO2 99%   Visual Acuity Right Eye Distance:   Left Eye Distance:   Bilateral Distance:    Right Eye Near:   Left Eye Near:    Bilateral Near:     Physical Exam Vitals signs and nursing note reviewed.  Constitutional:      General: He is not in acute distress.    Appearance: Normal appearance. He is well-developed and normal weight. He is not ill-appearing, toxic-appearing or diaphoretic.  HENT:     Head: Normocephalic and atraumatic.     Right Ear: Tympanic membrane and ear canal normal.     Left Ear: Tympanic membrane and ear canal normal.     Nose: Nose normal.     Mouth/Throat:     Pharynx: Oropharynx is clear.  Eyes:     Conjunctiva/sclera: Conjunctivae normal.  Neck:     Musculoskeletal: Neck supple.  Cardiovascular:     Rate and Rhythm: Normal rate and regular rhythm.     Heart sounds: No murmur.  Pulmonary:     Effort: Pulmonary effort is normal. No respiratory distress.      Breath sounds: Normal breath sounds.  Musculoskeletal: Normal range of motion.  Lymphadenopathy:     Cervical: Cervical adenopathy present.  Skin:    General: Skin is warm and dry.     Findings: No rash.  Neurological:     Mental Status: He is alert.  Psychiatric:        Mood and Affect: Mood normal.      UC Treatments / Results  Labs (all labs ordered are listed, but only abnormal results are displayed) Labs Reviewed  CBC WITH DIFFERENTIAL/PLATELET - Abnormal; Notable for the following components:      Result Value   Neutro Abs 1.3 (*)    All other components within normal limits  BASIC METABOLIC PANEL  HIV ANTIBODY (ROUTINE TESTING W REFLEX)  RPR  URINE CYTOLOGY ANCILLARY ONLY    EKG None  Radiology Dg Chest 2 View  Result Date: 10/31/2018 CLINICAL DATA:  Cough.  Smoker EXAM: CHEST - 2 VIEW COMPARISON:  01/06/2007 FINDINGS: The heart size and mediastinal contours are within normal limits. Both lungs are clear. The visualized skeletal structures are unremarkable. IMPRESSION: No active cardiopulmonary disease. Electronically Signed   By: Marlan Palau M.D.   On: 10/31/2018 15:19    Procedures Procedures (including critical care time)  Medications Ordered in UC Medications - No data to display  Initial Impression / Assessment and Plan / UC Course  I have reviewed the triage vital signs and the nursing notes.  Pertinent labs & imaging results that were available during my care of the patient were reviewed by me and considered in my medical decision making (see chart for details).     Patient is a 38 year old male with chronic lymphadenopathy for a long time.  He reports he is concerned for lung cancer.  X-ray was normal. CBC with differential and CMP pending STD testing pending VSS non toxic, no concerning symptoms.  Instructed patient that we will call with any abnormal or positive results Counseled on quitting smoking.  Pt understanding and he was instructed  to follow up with PCP.     Final Clinical Impressions(s) / UC Diagnoses   Final diagnoses:  Lymphadenopathy     Discharge Instructions     Your x-ray was negative for any abnormalities We are sending some blood work in the lab results are still pending We  will call you with any abnormal results We are also testing for STD and will call you with any positive results Follow up as needed for continued or worsening symptoms     ED Prescriptions    None     Controlled Substance Prescriptions [place] Controlled Substance Registry consulted? Not Applicable   Janace Aris, NP 11/01/18 [phone]

## 2018-11-01 LAB — RPR: RPR: REACTIVE — AB

## 2018-11-01 LAB — RPR, QUANT+TP ABS (REFLEX)
Rapid Plasma Reagin, Quant: 1:1 {titer} — ABNORMAL HIGH
TREPONEMA PALLIDUM AB: NONREACTIVE

## 2018-11-01 LAB — HIV ANTIBODY (ROUTINE TESTING W REFLEX): HIV SCREEN 4TH GENERATION: NONREACTIVE

## 2018-11-02 ENCOUNTER — Telehealth (HOSPITAL_COMMUNITY): Payer: Self-pay | Admitting: Emergency Medicine

## 2018-11-02 ENCOUNTER — Ambulatory Visit (HOSPITAL_COMMUNITY)
Admission: EM | Admit: 2018-11-02 | Discharge: 2018-11-02 | Disposition: A | Discharge status: Home / Self Care | Payer: Medicare Other | Attending: Nurse Practitioner | Admitting: Nurse Practitioner

## 2018-11-02 LAB — URINE CYTOLOGY ANCILLARY ONLY
Chlamydia: NEGATIVE
Neisseria Gonorrhea: NEGATIVE
Trichomonas: NEGATIVE

## 2018-11-02 MED ORDER — PENICILLIN G BENZATHINE 1200000 UNIT/2ML IM SUSP
INTRAMUSCULAR | Status: AC
  Filled 2018-11-02: qty 4

## 2018-11-02 MED ORDER — PENICILLIN G BENZATHINE 1200000 UNIT/2ML IM SUSP
2.4000 10*6.[IU] | Freq: Once | INTRAMUSCULAR | Status: AC
  Administered 2018-11-02: 2.4 10*6.[IU] via INTRAMUSCULAR

## 2018-11-02 NOTE — Telephone Encounter (Signed)
Spoke with pt given positive RPR results; pt verbalized understanding and to come in for treatment. [facility] notified

## 2018-11-02 NOTE — ED Triage Notes (Signed)
Pt presents for STD Treatment.  

## 2018-11-03 ENCOUNTER — Telehealth (HOSPITAL_COMMUNITY): Payer: Self-pay | Admitting: Family Medicine

## 2018-11-03 NOTE — ED Notes (Signed)
This chart was done in error.

## 2018-11-03 NOTE — Telephone Encounter (Signed)
The patient came in for STD testing.  His RPR was reactive.  Based on that test result he was called to come in for a shot of Bicillin LA.  He then had confirmatory testing done, and the antibody test was negative.  I called patient and let him know that he did not have syphilis.  He did not require referral to the health department for notification.  He did not require the penicillin shot.  The patient was gracious and thanked me for my call.  He was happy that he did not have syphilis.  He understood all this sequence of events could happen.  I did reassure him that we are taking measures to make sure it would not happen again by writing policy and double checking results.YSN

## 2021-02-19 ENCOUNTER — Emergency Department (HOSPITAL_COMMUNITY)
Admission: EM | Admit: 2021-02-19 | Discharge: 2021-02-19 | Disposition: A | Discharge status: Home / Self Care | Payer: Medicare Other | Attending: Emergency Medicine | Admitting: Emergency Medicine

## 2021-02-19 ENCOUNTER — Encounter (HOSPITAL_COMMUNITY): Payer: Self-pay

## 2021-02-19 ENCOUNTER — Emergency Department (HOSPITAL_COMMUNITY): Payer: Medicare Other

## 2021-02-19 ENCOUNTER — Other Ambulatory Visit: Payer: Self-pay

## 2021-02-19 DIAGNOSIS — J40 Bronchitis, not specified as acute or chronic: Secondary | ICD-10-CM | POA: Diagnosis not present

## 2021-02-19 DIAGNOSIS — F17210 Nicotine dependence, cigarettes, uncomplicated: Secondary | ICD-10-CM | POA: Diagnosis not present

## 2021-02-19 DIAGNOSIS — R0789 Other chest pain: Secondary | ICD-10-CM | POA: Insufficient documentation

## 2021-02-19 DIAGNOSIS — R0602 Shortness of breath: Secondary | ICD-10-CM | POA: Diagnosis not present

## 2021-02-19 DIAGNOSIS — R079 Chest pain, unspecified: Secondary | ICD-10-CM

## 2021-02-19 DIAGNOSIS — R062 Wheezing: Secondary | ICD-10-CM

## 2021-02-19 LAB — BASIC METABOLIC PANEL
Anion gap: 10 (ref 5–15)
Anion gap: 8 (ref 5–15)
BUN: 9 mg/dL (ref 6–20)
BUN: 9 mg/dL (ref 6–20)
CO2: 24 mmol/L (ref 22–32)
CO2: 26 mmol/L (ref 22–32)
Calcium: 9.2 mg/dL (ref 8.9–10.3)
Calcium: 9.3 mg/dL (ref 8.9–10.3)
Chloride: 105 mmol/L (ref 98–111)
Chloride: 105 mmol/L (ref 98–111)
Creatinine, Ser: 0.95 mg/dL (ref 0.61–1.24)
Creatinine, Ser: 0.93 mg/dL (ref 0.61–1.24)
GFR, Estimated: >60 mL/min (ref >60)
GFR, Estimated: >60 mL/min (ref >60)
Glucose, Bld: 99 mg/dL (ref 70–99)
Glucose, Bld: 96 mg/dL (ref 70–99)
Potassium: 3.5 mmol/L (ref 3.5–5.1)
Potassium: 3.9 mmol/L (ref 3.5–5.1)
Sodium: 139 mmol/L (ref 135–145)
Sodium: 139 mmol/L (ref 135–145)

## 2021-02-19 LAB — TROPONIN I (HIGH SENSITIVITY)
Troponin I (High Sensitivity): 5 ng/L (ref <18)
Troponin I (High Sensitivity): 4 ng/L (ref <18)

## 2021-02-19 LAB — CBC
HCT: 44.3 % (ref 39.0–52.0)
Hemoglobin: 14.8 g/dL (ref 13.0–17.0)
MCH: 32.3 pg (ref 26.0–34.0)
MCHC: 33.4 g/dL (ref 30.0–36.0)
MCV: 96.7 fL (ref 80.0–100.0)
Platelets: 254 10*3/uL (ref 150–400)
RBC: 4.58 MIL/uL (ref 4.22–5.81)
RDW: 11.8 % (ref 11.5–15.5)
WBC: 5.9 10*3/uL (ref 4.0–10.5)
nRBC: 0 % (ref 0.0–0.2)

## 2021-02-19 MED ORDER — METHYLPREDNISOLONE 4 MG PO TBPK: ORAL_TABLET | ORAL | 0 refills | Status: AC

## 2021-02-19 MED ORDER — AEROCHAMBER PLUS FLO-VU LARGE MISC
1.0000 | Freq: Once | Status: AC
  Administered 2021-02-19: 1
  Filled 2021-02-19 (×2): qty 1

## 2021-02-19 MED ORDER — ALBUTEROL SULFATE HFA 108 (90 BASE) MCG/ACT IN AERS
6.0000 | INHALATION_SPRAY | Freq: Once | RESPIRATORY_TRACT | Status: AC
  Administered 2021-02-19: 6 via RESPIRATORY_TRACT
  Filled 2021-02-19 (×2): qty 6.7

## 2021-02-19 MED ORDER — IPRATROPIUM BROMIDE HFA 17 MCG/ACT IN AERS
2.0000 | INHALATION_SPRAY | Freq: Once | RESPIRATORY_TRACT | Status: AC
  Administered 2021-02-19: 2 via RESPIRATORY_TRACT
  Filled 2021-02-19 (×2): qty 12.9

## 2021-02-19 NOTE — ED Provider Notes (Signed)
[facility] EMERGENCY DEPARTMENT Provider Note  CSN: [id number] Arrival date & time: 02/19/21 0051  Chief Complaint(s) Chest Pain (Tightness/) and Shortness of Breath  HPI Sean Gutierrez is a 40 y.o. male here with 2 days of shortness of breath and chest tightness.  Patient reports sick contact at home stating that his child had URI symptoms last week.  Patient is endorsing cough and chest congestion.  No fevers or chills.  No nausea or vomiting.  Shortness of breath has improved since yesterday but still present.  Worse with coughing.  Also associated chest tightness that is nonradiating.  No other physical complaints.  The history is provided by the patient.   Past Medical History No past medical history on file. Patient Active Problem List   Diagnosis Date Noted   OTHER NEUTROPENIA 11/07/2009   ALCOHOL ABUSE 08/04/2009   TOBACCO ABUSE 08/04/2009   LYMPHADENOPATHY 08/04/2009   Home Medication(s) Prior to Admission medications   Medication Sig Start Date End Date Taking? Authorizing Provider  methylPREDNISolone (MEDROL DOSEPAK) 4 MG TBPK tablet Use as directed on the package 02/19/21  Yes Annelle Behrendt, Amadeo Garnet, MD                                                                                                                                    Past Surgical History History reviewed. No pertinent surgical history. Family History Family History  Problem Relation Age of Onset   Healthy Mother    Healthy Father     Social History Social History   Tobacco Use   Smoking status: Every Day    Packs/day: 1.00    Pack years: 0.00    Types: Cigars, Cigarettes   Smokeless tobacco: Never  Substance Use Topics   Alcohol use: Yes   Drug use: No   Allergies Patient has no known allergies.  Review of Systems Review of Systems All other systems are reviewed and are negative for acute change except as noted in the HPI  Physical Exam Vital Signs  I have reviewed  the triage vital signs BP (!) 147/102 (BP Location: Left Arm)   Pulse 73   Temp 97.7 F (36.5 C) (Oral)   Resp (!) 22   SpO2 97%   Physical Exam Vitals reviewed.  Constitutional:      General: He is not in acute distress.    Appearance: He is well-developed. He is not diaphoretic.  HENT:     Head: Normocephalic and atraumatic.     Nose: Nose normal.  Eyes:     General: No scleral icterus.       Right eye: No discharge.        Left eye: No discharge.     Conjunctiva/sclera: Conjunctivae normal.     Pupils: Pupils are equal, round, and reactive to light.  Cardiovascular:     Rate and Rhythm: Normal rate and regular rhythm.  Heart sounds: No murmur heard.   No friction rub. No gallop.  Pulmonary:     Effort: Pulmonary effort is normal. No respiratory distress.     Breath sounds: Normal breath sounds. No stridor. No rales.  Abdominal:     General: There is no distension.     Palpations: Abdomen is soft.     Tenderness: There is no abdominal tenderness.  Musculoskeletal:        General: No tenderness.     Cervical back: Normal range of motion and neck supple.  Skin:    General: Skin is warm and dry.     Findings: No erythema or rash.  Neurological:     Mental Status: He is alert and oriented to person, place, and time.    ED Results and Treatments Labs (all labs ordered are listed, but only abnormal results are displayed) Labs Reviewed  BASIC METABOLIC PANEL  CBC  BASIC METABOLIC PANEL  TROPONIN I (HIGH SENSITIVITY)  TROPONIN I (HIGH SENSITIVITY)                                                                                                                         EKG  EKG Interpretation  Date/Time:  Thursday February 19 2021 05:05:45 EDT Ventricular Rate:  81 PR Interval:  241 QRS Duration: 94 QT Interval:  364 QTC Calculation: 423 R Axis:   84 Text Interpretation: Sinus rhythm Prolonged PR interval Left ventricular hypertrophy ST elev, probable normal early  repol pattern No acute changes Confirmed by Drema Pry [phone]) on 02/19/2021 7:27:23 AM        Radiology DG Chest 2 View  Result Date: 02/19/2021 CLINICAL DATA:  Shortness of breath and chest pain. History of smoking. EXAM: CHEST - 2 VIEW COMPARISON:  Chest x-ray 10/31/2018 FINDINGS: The heart size and mediastinal contours are unchanged. Hyperinflation. No focal consolidation. No pulmonary edema. No pleural effusion. No pneumothorax. No acute osseous abnormality. IMPRESSION: No active cardiopulmonary disease. Electronically Signed   By: Tish Frederickson M.D.   On: 02/19/2021 02:57    Pertinent labs & imaging results that were available during my care of the patient were reviewed by me and considered in my medical decision making (see chart for details).  Medications Ordered in ED Medications  albuterol (VENTOLIN HFA) 108 (90 Base) MCG/ACT inhaler 6 puff (6 puffs Inhalation Given 02/19/21 0618)  ipratropium (ATROVENT HFA) inhaler 2 puff (2 puffs Inhalation Given 02/19/21 0621)  AeroChamber Plus Flo-Vu Large MISC 1 each (1 each Other Given 02/19/21 0622)  Procedures .1-3 Lead EKG Interpretation  Date/Time: 02/19/2021 7:34 AM Performed by: Nira Conn, MD Authorized by: Nira Conn, MD     Interpretation: normal     ECG rate:  78   ECG rate assessment: normal     Rhythm: sinus rhythm     Ectopy: none     Counseled patient for approximately 10 minutes regarding smoking cessation. Discussed risks of smoking and how they applied and affected their visit here today. Patient is ready to quit at this time, provided with cessation information.  CPT code: 24235: intermediate counseling for smoking cessation   (including critical care time)  Medical Decision Making / ED Course I have reviewed the nursing notes for this encounter and the patient's  prior records (if available in EHR or on provided paperwork).   ERAN MISTRY was evaluated in Emergency Department on 02/19/2021 for the symptoms described in the history of present illness. He was evaluated in the context of the global COVID-19 pandemic, which necessitated consideration that the patient might be at risk for infection with the SARS-CoV-2 virus that causes COVID-19. Institutional protocols and algorithms that pertain to the evaluation of patients at risk for COVID-19 are in a state of rapid change based on information released by regulatory bodies including the CDC and federal and state organizations. These policies and algorithms were followed during the patient's care in the ED.  Patient here with chest tightness and shortness of breath. Diffuse inspiratory and expiratory wheezes on exam. Denies a prior history of asthma. Admits to chronic smoking for 20 pack years.  EKG without acute ischemic changes or evidence of pericarditis. Triage labs grossly reassuring without leukocytosis or anemia.  No significant electrolyte derangement or renal sufficiency.  Troponin negative x2.  More consistent with bronchitis. Low suspicion for ACS, PE, aortic dissection, or esophageal perforation.  Chest x-ray without evidence suggestive of pneumonia, pneumothorax, pneumomediastinum.  No abnormal contour of the mediastinum to suggest dissection. No evidence of acute injuries.  Patient's shortness of breath and chest tightness completely resolved after high-dose albuterol and Atrovent puffs.  On reassessment patient still had some wheezing and was given additional 4 albuterol puffs.  Wheezing improved.  Will discharge with albuterol puff and steroid taper.  Patient counseled on smoking cessation.     Final Clinical Impression(s) / ED Diagnoses Final diagnoses:  Bronchitis  Wheezing    The patient appears reasonably screened and/or stabilized for discharge and I doubt any other  medical condition or other [facility] requiring further screening, evaluation, or treatment in the ED at this time prior to discharge. Safe for discharge with strict return precautions.  Disposition: Discharge  Condition: Good  I have discussed the results, Dx and Tx plan with the patient/family who expressed understanding and agree(s) with the plan. Discharge instructions discussed at length. The patient/family was given strict return precautions who verbalized understanding of the instructions. No further questions at time of discharge.    ED Discharge Orders          Ordered    methylPREDNISolone (MEDROL DOSEPAK) 4 MG TBPK tablet        02/19/21 3614             Follow Up: Primary care provider  Schedule an appointment as soon as possible for a visit  if you do not have a primary care physician, contact [facility] at [phone] for referral     This chart was dictated using voice recognition software.  Despite best efforts to  proofread,  errors can occur which can change the documentation meaning.    Nira Connardama, Adaleena Mooers Eduardo, MD 02/19/21 [phone]

## 2021-02-19 NOTE — ED Notes (Signed)
Pt reports feeling better with inhaler. Alert and oriented x 4. 97% on RA.

## 2021-02-19 NOTE — ED Triage Notes (Signed)
Pt reports CP and SOB that started yesterday, worse today, denies other cardiac symptoms.

## 2021-02-19 NOTE — ED Notes (Signed)
Wheezing since yesterday  No hx of copd or asthma

## 2021-02-19 NOTE — Discharge Instructions (Addendum)
Take 4 puffs every 4 hours for the rest of today. Starting tomorrow, take 2 to 4 puffs every 4-6 hours as needed for shortness of breath or chest tightness.

## 2022-12-13 IMAGING — DX DG CHEST 2V
2 series · 2 of 2 positions shown · non-contrast
Comparison: Chest x-ray 10/31/2018

CLINICAL DATA: Shortness of breath and chest pain. History of
smoking.

EXAM:
CHEST - 2 VIEW

[chest pa]
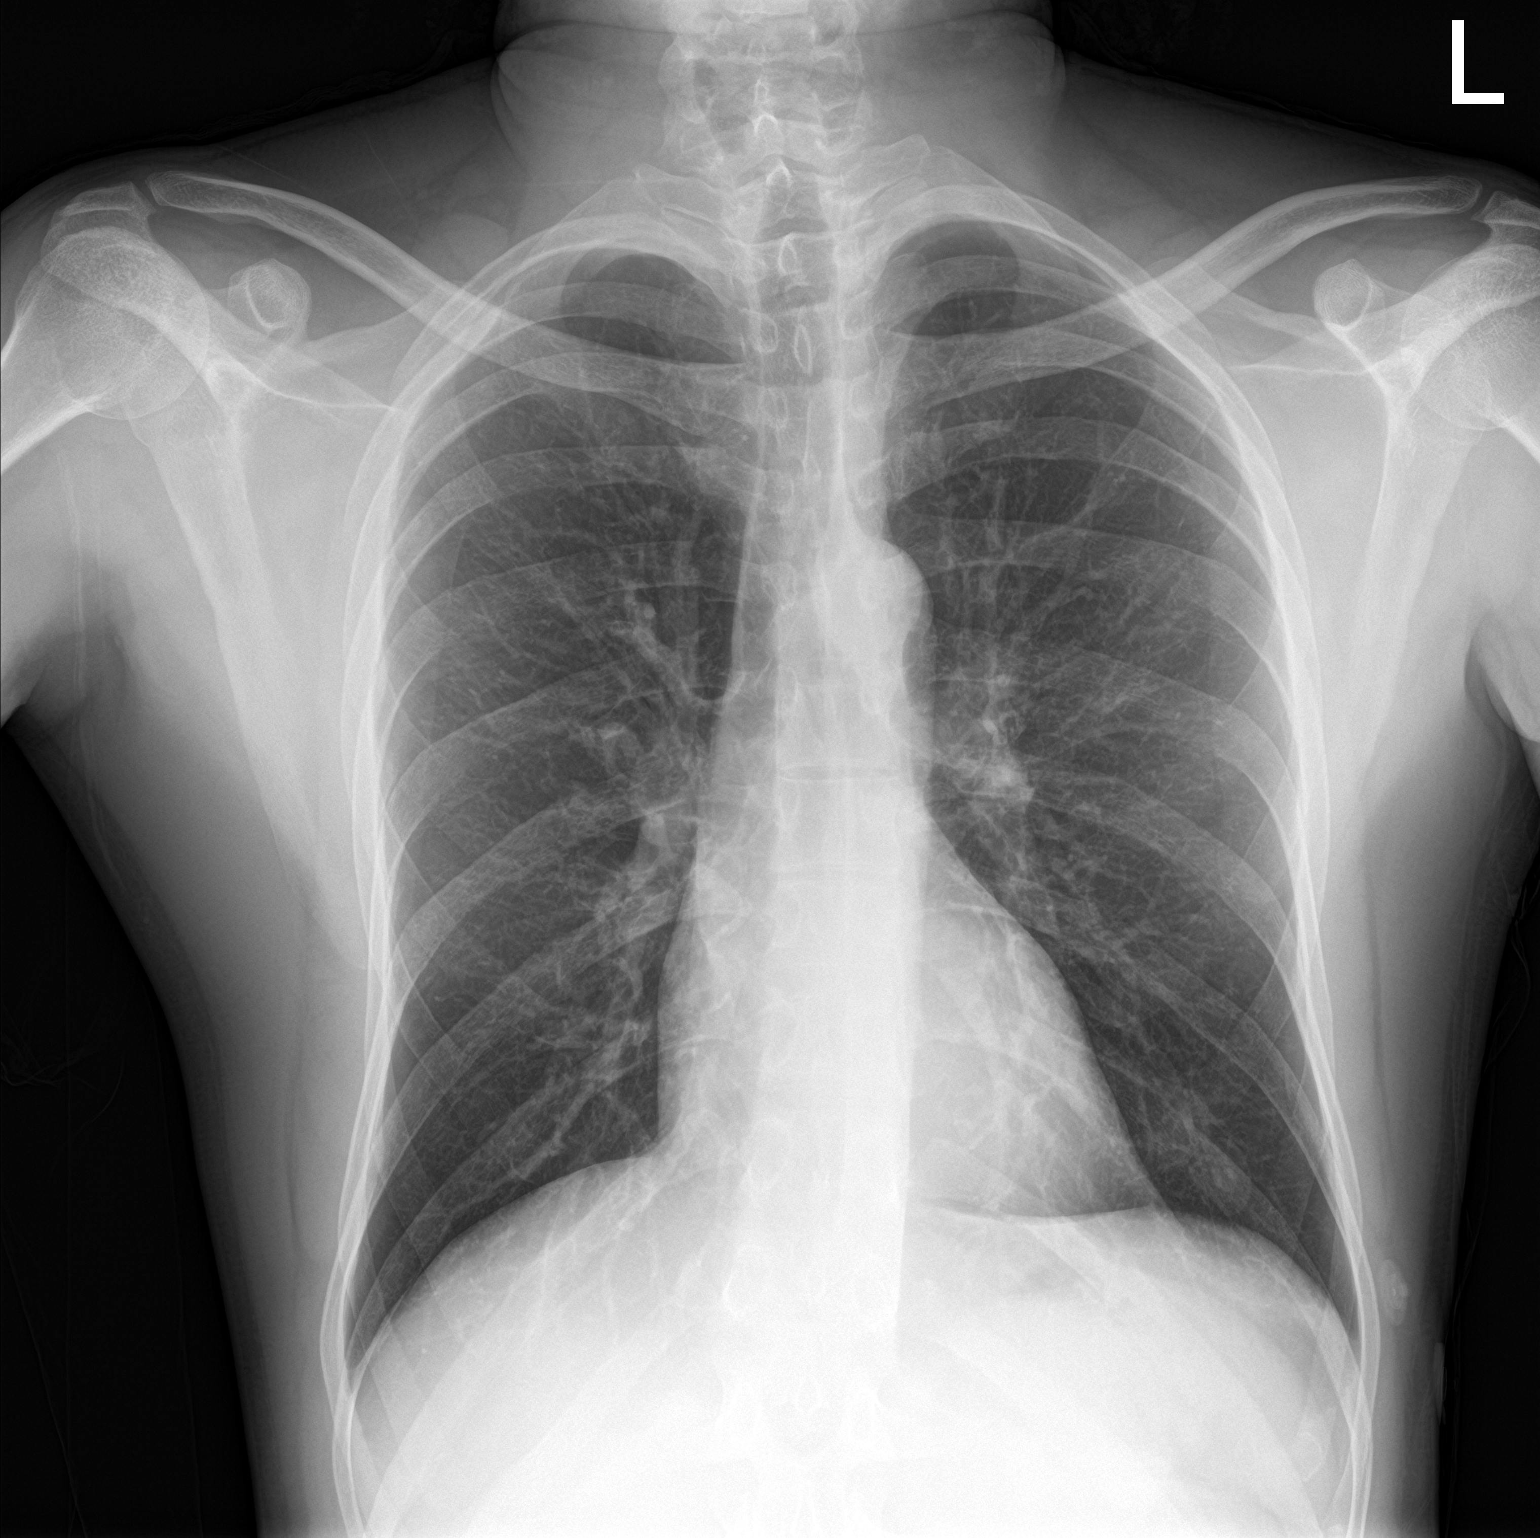

[chest lat]
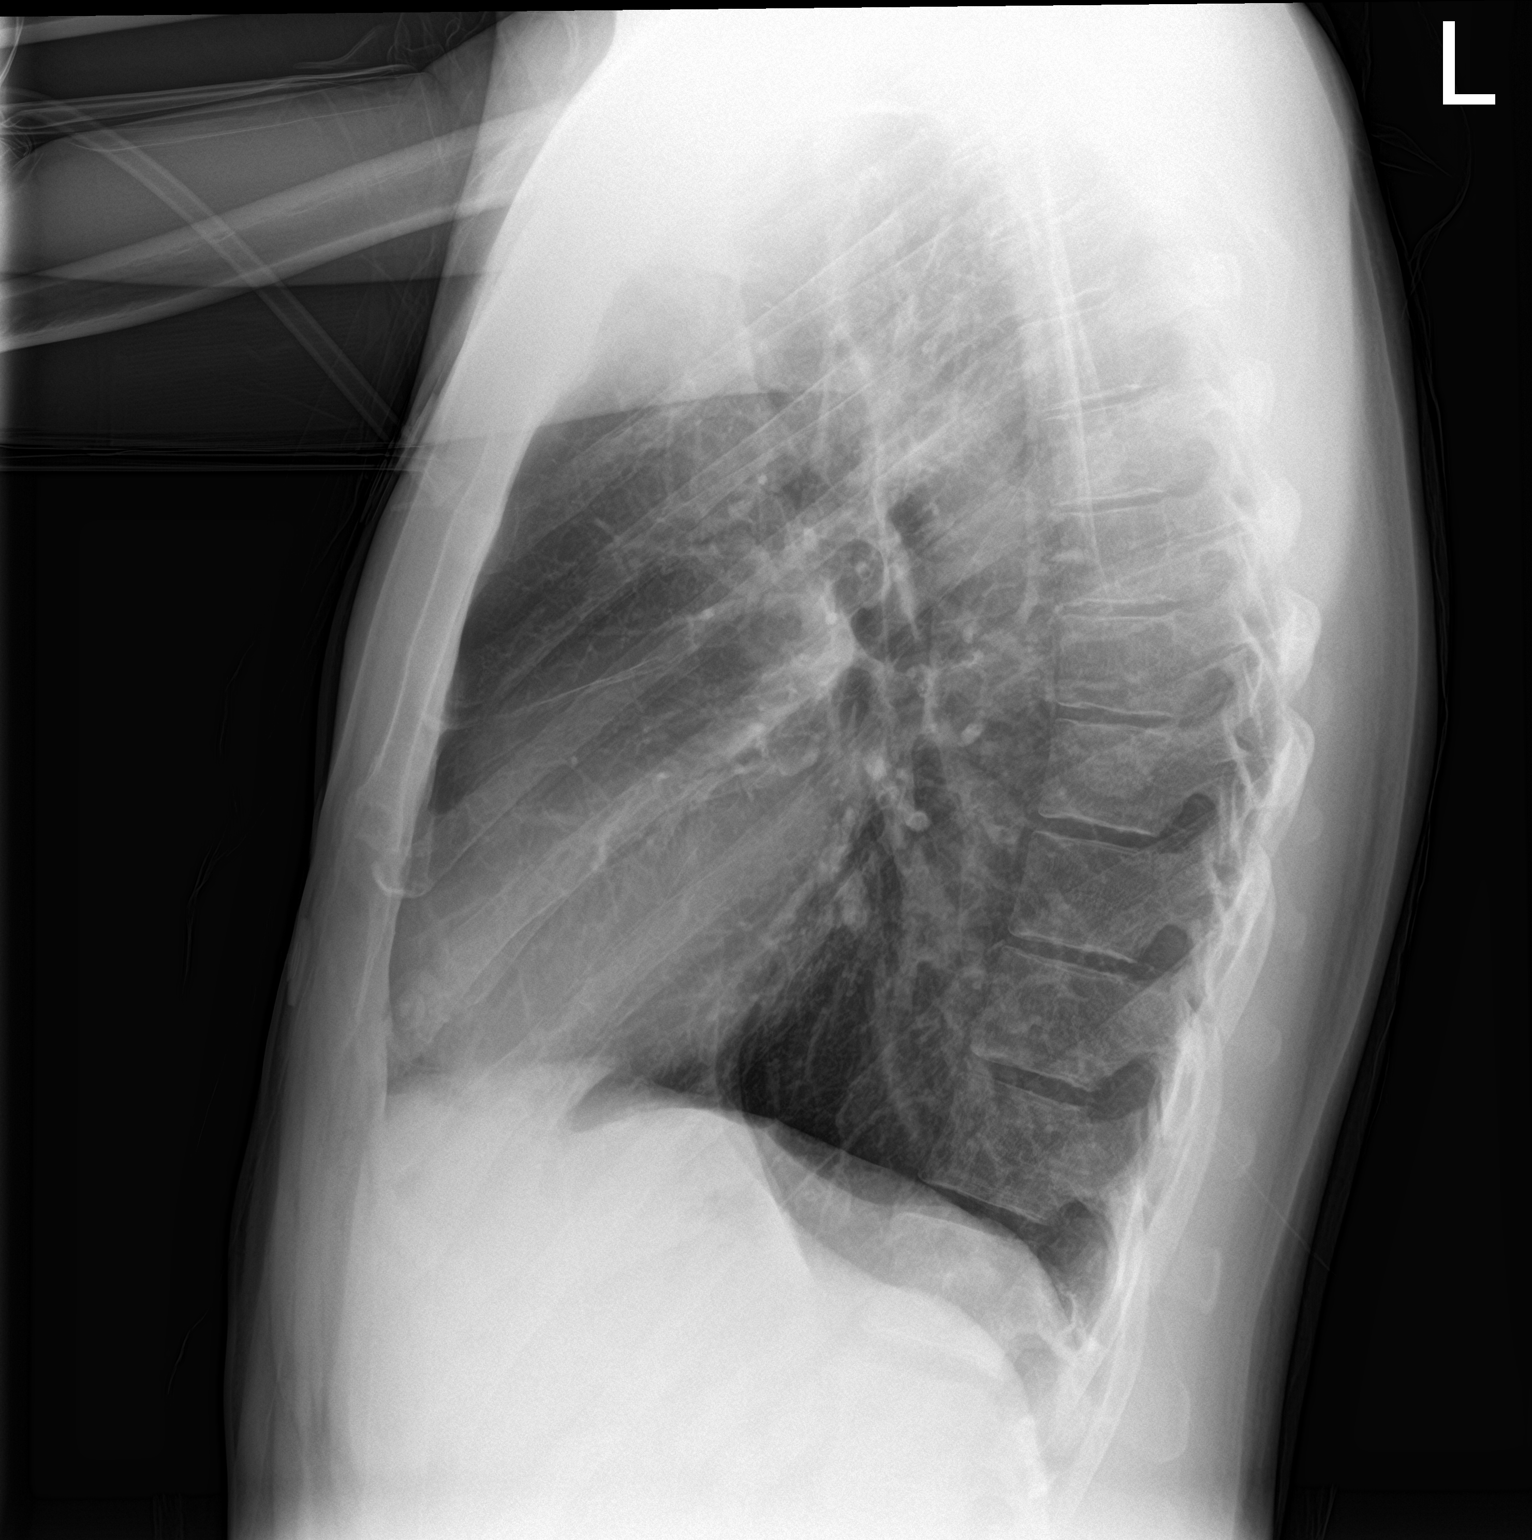

[2 of 2 positions shown; findings below may reference images not displayed]

FINDINGS: The heart size and mediastinal contours are unchanged.

Hyperinflation. No focal consolidation. No pulmonary edema. No
pleural effusion. No pneumothorax.

No acute osseous abnormality.
IMPRESSION: No active cardiopulmonary disease.

## 2023-11-08 ENCOUNTER — Emergency Department (HOSPITAL_COMMUNITY)
Admission: EM | Admit: 2023-11-08 | Discharge: 2023-11-09 | Discharge status: Against Medical Advice | Payer: Medicare Other | Attending: Emergency Medicine | Admitting: Emergency Medicine

## 2023-11-08 ENCOUNTER — Encounter (HOSPITAL_COMMUNITY): Payer: Self-pay | Admitting: Emergency Medicine

## 2023-11-08 ENCOUNTER — Other Ambulatory Visit: Payer: Self-pay

## 2023-11-08 DIAGNOSIS — R066 Hiccough: Secondary | ICD-10-CM | POA: Diagnosis present

## 2023-11-08 DIAGNOSIS — Z5321 Procedure and treatment not carried out due to patient leaving prior to being seen by health care provider: Secondary | ICD-10-CM | POA: Insufficient documentation

## 2023-11-08 NOTE — ED Triage Notes (Signed)
 Patient c/o hiccups x 3 days. Patient denies N/V. Patient denies Fever.

## 2023-11-09 NOTE — ED Notes (Signed)
 Pt left the building, and did not answer when called.
